# Patient Record
Sex: Female | Born: 1960 | Race: White | Hispanic: No | Marital: Married | Smoking: Never smoker
Health system: Southern US, Community
[De-identification: ages and names within clinical notes are randomized; demographics above are authoritative.]

## PROBLEM LIST (undated history)

## (undated) DIAGNOSIS — R319 Hematuria, unspecified: Secondary | ICD-10-CM

## (undated) HISTORY — PX: BLADDER SURGERY: SHX569

## (undated) HISTORY — PX: BREAST SURGERY: SHX581

## (undated) HISTORY — PX: ABDOMINAL HYSTERECTOMY: SHX81

## (undated) HISTORY — PX: BREAST ENHANCEMENT SURGERY: SHX7

---

## 2000-12-20 ENCOUNTER — Ambulatory Visit (HOSPITAL_COMMUNITY): Admission: RE | Admit: 2000-12-20 | Discharge: 2000-12-20 | Payer: Self-pay | Admitting: Family Medicine

## 2000-12-20 ENCOUNTER — Encounter: Payer: Self-pay | Admitting: Family Medicine

## 2002-11-16 ENCOUNTER — Inpatient Hospital Stay (HOSPITAL_COMMUNITY): Admission: AD | Admit: 2002-11-16 | Discharge: 2002-11-17 | Payer: Self-pay | Admitting: Cardiovascular Disease

## 2002-11-16 ENCOUNTER — Encounter: Payer: Self-pay | Admitting: Cardiovascular Disease

## 2002-11-17 ENCOUNTER — Encounter: Payer: Self-pay | Admitting: Cardiovascular Disease

## 2002-11-23 ENCOUNTER — Encounter: Payer: Self-pay | Admitting: Cardiovascular Disease

## 2002-11-23 ENCOUNTER — Ambulatory Visit (HOSPITAL_COMMUNITY): Admission: RE | Admit: 2002-11-23 | Discharge: 2002-11-23 | Payer: Self-pay | Admitting: Cardiovascular Disease

## 2003-01-10 ENCOUNTER — Encounter: Admission: RE | Admit: 2003-01-10 | Discharge: 2003-01-10 | Payer: Self-pay | Admitting: Cardiovascular Disease

## 2003-01-10 ENCOUNTER — Encounter: Payer: Self-pay | Admitting: Cardiovascular Disease

## 2003-08-03 ENCOUNTER — Ambulatory Visit (HOSPITAL_COMMUNITY): Admission: RE | Admit: 2003-08-03 | Discharge: 2003-08-03 | Payer: Self-pay | Admitting: Otolaryngology

## 2003-08-16 ENCOUNTER — Ambulatory Visit (HOSPITAL_COMMUNITY): Admission: RE | Admit: 2003-08-16 | Discharge: 2003-08-16 | Payer: Self-pay | Admitting: Otolaryngology

## 2003-08-16 ENCOUNTER — Encounter (INDEPENDENT_AMBULATORY_CARE_PROVIDER_SITE_OTHER): Payer: Self-pay | Admitting: *Deleted

## 2003-09-19 ENCOUNTER — Observation Stay (HOSPITAL_COMMUNITY): Admission: RE | Admit: 2003-09-19 | Discharge: 2003-09-20 | Payer: Self-pay | Admitting: Urology

## 2005-04-28 ENCOUNTER — Emergency Department (HOSPITAL_COMMUNITY): Admission: EM | Admit: 2005-04-28 | Discharge: 2005-04-28 | Payer: Self-pay | Admitting: Emergency Medicine

## 2006-01-12 ENCOUNTER — Inpatient Hospital Stay (HOSPITAL_COMMUNITY): Admission: EM | Admit: 2006-01-12 | Discharge: 2006-01-13 | Payer: Self-pay | Admitting: Cardiovascular Disease

## 2006-10-18 ENCOUNTER — Ambulatory Visit (HOSPITAL_COMMUNITY): Admission: RE | Admit: 2006-10-18 | Discharge: 2006-10-18 | Payer: Self-pay | Admitting: Cardiovascular Disease

## 2006-11-16 ENCOUNTER — Encounter: Admission: RE | Admit: 2006-11-16 | Discharge: 2006-11-16 | Payer: Self-pay | Admitting: Cardiovascular Disease

## 2007-10-20 ENCOUNTER — Ambulatory Visit (HOSPITAL_COMMUNITY): Admission: RE | Admit: 2007-10-20 | Discharge: 2007-10-20 | Payer: Self-pay | Admitting: Cardiovascular Disease

## 2008-10-19 ENCOUNTER — Ambulatory Visit (HOSPITAL_COMMUNITY): Admission: RE | Admit: 2008-10-19 | Discharge: 2008-10-19 | Payer: Self-pay

## 2009-10-29 ENCOUNTER — Ambulatory Visit (HOSPITAL_COMMUNITY): Admission: RE | Admit: 2009-10-29 | Discharge: 2009-10-29 | Payer: Self-pay | Admitting: Cardiovascular Disease

## 2010-10-25 ENCOUNTER — Encounter: Payer: Self-pay | Admitting: Cardiovascular Disease

## 2010-11-03 ENCOUNTER — Other Ambulatory Visit (HOSPITAL_COMMUNITY): Payer: Self-pay | Admitting: Cardiovascular Disease

## 2010-11-03 ENCOUNTER — Other Ambulatory Visit (HOSPITAL_BASED_OUTPATIENT_CLINIC_OR_DEPARTMENT_OTHER): Payer: Self-pay | Admitting: Cardiovascular Disease

## 2010-11-03 DIAGNOSIS — Z139 Encounter for screening, unspecified: Secondary | ICD-10-CM

## 2010-11-04 ENCOUNTER — Ambulatory Visit (HOSPITAL_COMMUNITY): Admission: RE | Admit: 2010-11-04 | Payer: Self-pay | Source: Home / Self Care | Admitting: Cardiovascular Disease

## 2010-11-08 ENCOUNTER — Encounter: Payer: Self-pay | Admitting: Cardiovascular Disease

## 2010-11-11 ENCOUNTER — Other Ambulatory Visit (HOSPITAL_COMMUNITY): Payer: Self-pay | Admitting: Cardiovascular Disease

## 2010-11-11 ENCOUNTER — Ambulatory Visit (HOSPITAL_COMMUNITY)
Admission: RE | Admit: 2010-11-11 | Discharge: 2010-11-11 | Disposition: A | Discharge status: Home / Self Care | Payer: BC Managed Care – PPO | Source: Ambulatory Visit | Attending: Cardiovascular Disease | Admitting: Cardiovascular Disease

## 2010-11-11 DIAGNOSIS — Z139 Encounter for screening, unspecified: Secondary | ICD-10-CM

## 2010-11-11 DIAGNOSIS — Z1231 Encounter for screening mammogram for malignant neoplasm of breast: Secondary | ICD-10-CM

## 2011-02-20 NOTE — Op Note (Signed)
Linda Carey, Linda Carey                           ACCOUNT NO.:  [id number]   MEDICAL RECORD NO.:  [id number]                   PATIENT TYPE:  OBV   LOCATION:  0356                                 FACILITY:  [facility]   PHYSICIAN:  Mark C. Vernie Ammons, M.D.               DATE OF BIRTH:  May 25, 1961   DATE OF PROCEDURE:  09/19/2003  DATE OF DISCHARGE:                                 OPERATIVE REPORT   PREOPERATIVE DIAGNOSIS:  Cystocele.   POSTOPERATIVE DIAGNOSIS:  Cystocele.   OPERATION/PROCEDURE:  Anterior repair.   SURGEON:  Mark C. Vernie Ammons, M.D.   ASSISTANT:  Maretta Bees. Vonita Moss, M.D.   ANESTHESIA:  General.   ESTIMATED BLOOD LOSS:  Approximately 25 mL.   DRAINS:  16-French Foley catheter.   SPECIMENS:  None.   COMPLICATIONS:  None.   INDICATIONS:  The patient is a 50 year old white female with a history of  stress urinary incontinence.  She was found on exam to have a significant  cystocele, no rectocele or enterocele.  She had undergone a previous vaginal  hysterectomy.  She is brought to the operating room today for a sling  procedure and repair of her cystocele.   DESCRIPTION OF PROCEDURE:  After informed consent was obtained, the patient  was brought to the major OR, placed on the table, administered general  anesthesia, then moved to the dorsal lithotomy position.  Her genitalia and  vagina were sterilely prepped and draped.  A 16-French Foley catheter was  then placed in the bladder, the bladder was drained and 1% lidocaine with  epinephrine was used to infiltrate the subvaginal mucosa in the midline.  After allowing adequate time for epinephrine effect, a midline incision was  then made after a weighted speculum was placed in the vagina.  A combination  of sharp and blunt dissection was then used to dissect the vaginal mucosa  from the cystocele laterally, first on the right and then left sides with  Allis clamps being placed on the vaginal mucosa sequentially.  I then  reapproximated the lateral fascia in the midline with interrupted 2-0 Vicryl  sutures to completely obliterate the cystocele.  A single 3-0 chromic suture  was placed in a bleeding vein with good control and then Dr. Vonita Moss  performed the [facility] sling procedure which has been dictated by him  separately.  After the sling was noted to be in good position, the wound was  irrigated with antibiotic solution and the redundant vaginal mucosa was  excised.  The mucosa was then reapproximated in the midline with a running 2-  0 Vicryl suture.  Vaginal packing, which was coated with Neosporin, was then  inserted and the patient was awakened and taken to  the recovery room.  Her suprapubic incisions were closed with Dermabond.  She tolerated the procedure well. There were no intraoperative  complications.  Needle, sponge and instrument counts were reported correct  at the end of the operation.                                               Mark C. Vernie Ammons, M.D.    MCO/MEDQ  D:  09/19/2003  T:  09/19/2003  Job:  [id number]

## 2011-02-20 NOTE — Discharge Summary (Signed)
Linda Carey, Linda Carey                           ACCOUNT NO.:  [id number]   MEDICAL RECORD NO.:  [id number]                   PATIENT TYPE:  INP   LOCATION:  3730                                 FACILITY:  [facility]   PHYSICIAN:  Ricki Rodriguez, M.D.               DATE OF BIRTH:  19-Jun-1961   DATE OF ADMISSION:  11/16/2002  DATE OF DISCHARGE:  11/17/2002                                 DISCHARGE SUMMARY   DISCHARGE DIAGNOSES:  1. Noncardiac chest pain.  2. Possible reflux esophagitis.   DISCHARGE MEDICATIONS:  1. Nexium 40 mg one daily.  2. Darvocet-N 100 four times daily as needed.  3. Aleve daily x1 week.   ACTIVITY:  As tolerated without lifting, pulling or pushing x1 week.   DIET:  Low fat, low salt diet.   SPECIAL INSTRUCTIONS:  The patient to get mammogram and GYN followup and ask  for a gastroenterology consult.   FOLLOW UP:  Follow up with Dr. Algie Coffer in two weeks.   HISTORY OF PRESENT ILLNESS:  This 50 year old, white female presented with  one-day history of dull precordial chest pain not increased by breathing.  The patient had transient ST depression in less than three minutes of  treadmill stress test.  She has a history of alcohol use in the past with  reflux esophagitis.  No cardiac risk factors of diabetes, hypertension,  smoking, obesity or family history of coronary artery disease.  She had a  hysterectomy 10 years ago and gallbladder surgery 26 years ago.   PHYSICAL EXAMINATION:  VITAL SIGNS:  Temperature 97.2, pulse 94,  respirations 22, blood pressure 103/55, height 5 feet 2 inches, weight 136  pounds.  GENERAL:  The patient was alert and oriented x3.  HEENT:  Head normocephalic and atraumatic.  Hazel eyes.  TMs okay.  Sclerae  pink and moist.  NECK:  No JVD or carotid bruit.  BREASTS:  Implants with scar of surgery.  Chest wall nontender.  HEART:  Normal S1, S2.  ABDOMEN:  Soft and nontender.  EXTREMITIES:  No clubbing, cyanosis or edema.  NEUROLOGIC:  Cranial nerves 2-12 grossly intact.   LABORATORY DATA AND X-RAY FINDINGS:  Borderline hemoglobin of 11.9,  hematocrit 34, WBC normal, platelets normal.  Sodium borderline at 134,  potassium normal, glucose borderline 132, BUN and creatinine normal.  CK-MB  and troponin I normal.  Iron level normal.  X-ray of chest unremarkable.   Cardiolite stress test with ejection fraction of 64% and normal left  ventricular wall motion without any reversible ischemia.  EKG revealed  normal sinus rhythm.   HOSPITAL COURSE:  The patient was admitted to the telemetry unit.  Myocardial infarction was ruled out.  She underwent a stress test that  failed to show any ischemia.  The patient had a normal iron study.  She was  advised to have a gastroenterology and GYN followup on outpatient  basis.  She was placed on proton pump inhibitor, analgesic with other medications.  She will be followed by me in two weeks.   CONDITION ON DISCHARGE:  Stable.                                               Ricki Rodriguez, M.D.    ASK/MEDQ  D:  02/09/2003  T:  02/12/2003  Job:  [id number]

## 2011-02-20 NOTE — Discharge Summary (Signed)
NAMELAELIA, ANGELO                 ACCOUNT NO.:  [id number]   MEDICAL RECORD NO.:  [id number]          PATIENT TYPE:  INP   LOCATION:  3706                         FACILITY:  [facility]   PHYSICIAN:  Ricki Rodriguez, M.D.  DATE OF BIRTH:  October 16, 1960   DATE OF ADMISSION:  01/12/2006  DATE OF DISCHARGE:  01/13/2006                                 DISCHARGE SUMMARY   PRINCIPAL DIAGNOSES:  1.  Chest pain.  2.  Family history of ischemic heart disease.   PRINCIPAL PROCEDURE:  Left heart catheterization, selective coronary  angiography, left ventricular function study on 01/13/2006.   DISCHARGE MEDICATIONS:  Nexium 40 mg 1 daily.  Aspirin 81 mg 1 daily.  Calcium 600 mg 1 or 2 daily.   FOLLOWUP:  Follow up with Dr. Orpah Cobb in 2 weeks.  Patient to call [phone].   SPECIAL INSTRUCTIONS:  Patient to remind the doctor for referral to GI  specialist.   DIET:  Low-fat, low-salt diet.   ACTIVITY:  No driving or lifting for 2 days.   INSTRUCTIONS:  Patient to notify of right groin pain, swelling, or  discharge.   HISTORY:  This 50 year old white female with recurrent chest pain described  as tightness around the chest with a sweating spell.  Was admitted for rule-  out myocardial infarction.  Her cardiac risk factors were unremarkable  except for premature coronary artery disease in the family.   PHYSICAL EXAMINATION:  Pulse 70, respiration 20, blood pressure 110/70,  height 5 feet 2 inches, weight 130 pounds.   GENERAL:  Patient is alert and oriented x3.   HEENT:  Patient is normocephalic and atraumatic with hazel eyes, equally  full, round, and reactive to light.  Extraocular movements are intact.  Conjunctivae are pink.  Sclerae are white.   NECK:  No JVD.  No carotid bruit.   LUNGS:  Clear bilaterally.   HEART:  Normal S1, S2.   ABDOMEN:  Soft and nontender.   EXTREMITIES:  No edema, cyanosis or clubbing.   CNS:  Cranial nerves grossly intact.  Patient has  bilaterally full grips.   LABORATORY DATA:  Normal hemoglobin and hematocrit, initial WBC count was  falsely high at 30,400.  Subsequent WBC count was 8300, platelet counts were  normal.  Electrolytes were near-normal.  BUN 12, creatinine 0.9.  Liver  enzymes were normal.  Cholesterol, triglycerides, and LDL levels were  normal.   Chest x-ray was unremarkable.  An EKG was normal.  Cardiac catheterization  showed normal coronaries and normal LV systolic function.   HOSPITAL COURSE:  Patient was admitted to telemetry unit.  Myocardial  infarction was ruled out.  She underwent cardiac catheterization that showed  normal coronaries and normal LV systolic function, and the patient was  discharged the same day with a followup as needed in 2 weeks and was given  instruction for a referral to GI specialist.  She was started on Nexium 40  mg 1 daily.      Ricki Rodriguez, M.D.  Electronically Signed     ASK/MEDQ  D:  03/24/2006  T:  03/24/2006  Job:  [id number]

## 2011-02-20 NOTE — H&P (Signed)
Linda Carey, Linda Carey                 ACCOUNT NO.:  [id number]   MEDICAL RECORD NO.:  [id number]          PATIENT TYPE:  INP   LOCATION:  3706                         FACILITY:  [facility]   PHYSICIAN:  Ricki Rodriguez, M.D.  DATE OF BIRTH:  04/03/1961   DATE OF ADMISSION:  01/12/2006  DATE OF DISCHARGE:                                HISTORY & PHYSICAL   CHIEF COMPLAINT:  Chest pain.   HISTORY OF PRESENT ILLNESS:  This is a 50 year old white female who comes in  for recurrent chest pain described as tightness for the last one week  yesterday.  She had mild chest pain today.  She had sweaty, hot feeling,  along with chest tightness, followed by diarrhea episode.  When she returned  to work, she had a vomiting episode.  Currently, the patient is chest  painfree.   PAST MEDICAL HISTORY:  Negative for diabetes, hypertension, smoking.  Positive for rare alcohol intake.  Negative for drug intake, elevated  cholesterol level, myocardial infarction, obesity, exercise.   FAMILY HISTORY:  Premature coronary artery disease.  Positive history of  reflux esophagitis, history of migraine headache, history of Meniere's  disease followed by Dr. Hermelinda Medicus an ENT physician.   PAST SURGICAL HISTORY:  1.  Hysterectomy 13 years ago.  2.  Gallbladder surgery 29 years ago.  3.  Bladder surgery 2004.  4.  Bilateral breast implants in 2003.   MEDICATIONS:  Aspirin, Plavix, Ranexa was ordered but the patient did not  take.   ALLERGIES:  CLINDAMYCIN causing rash.   SOCIAL HISTORY:  The patient is married.  Husband is a 60 year old has  hypertension.  The patient has one daughter, Duwayne Heck, 37 year old.   FAMILY HISTORY:  Mother living at age 12 with blood pressure and cholesterol  problem.  Father living at age 58 and healthy.  The patient's has one  brother living and well and two sisters living and well.   REVIEW OF SYSTEMS:  The patient denies any recent weight gain, weight loss.  Admits to  wearing glasses.  Occasional dizziness, chest pain, constipation.  No history of hearing loss.  Positive history of tinnitus.  Negative history  of cough, asthma, hemoptysis, pneumonia, dyspnea, leg edema, bleeding from  the bowels, stomach ulcers, hepatitis, blood transfusion, kidney stone,  stroke, seizures or psychiatric admissions.  Positive history of reflux  esophagitis.   PHYSICAL EXAMINATION:  VITAL SIGNS:  Pulse 70, respiration 20, blood  pressure 110/70.  Height 5 feet 2 inch, weight approximately 130 pounds.  GENERAL:  The patient is alert and oriented x3.  HEENT:  The patient is normocephalic and atraumatic with hazel eyes.  Pupils  are equal, round, and reactive to light.  Extraocular movements intact.  Conjunctivae pink.  Sclerae white.  NECK:  No JVD, no carotid bruits.  LUNGS:  Clear bilaterally.  HEART:  Normal S1 and S2.  ABDOMEN:  Soft and nontender.  EXTREMITIES:  No clubbing, cyanosis, or edema.  CNS:  Cranial nerves grossly intact.  Bilateral equal grips.   LABORATORY DATA:  Pending.  EKG done in the  office was normal sinus rhythm  with vertical ectopy.   Chest x-ray pending.   IMPRESSION:  1.  Chest pain.  2.  Anxiety.   PLAN:  Admit the patient to telemetry unit to rule out myocardial  infarction.  Cardiac catheterization versus nuclear stress test.      Ricki Rodriguez, M.D.  Electronically Signed     ASK/MEDQ  D:  01/12/2006  T:  01/12/2006  Job:  [id number]

## 2011-02-20 NOTE — H&P (Signed)
   NAMEGIANNAMARIE, PAULUS                           ACCOUNT NO.:  [id number]   MEDICAL RECORD NO.:  [id number]                   PATIENT TYPE:  OIB   LOCATION:  2899                                 FACILITY:  [facility]   PHYSICIAN:  Hermelinda Medicus, M.D.                DATE OF BIRTH:  1960-12-18   DATE OF ADMISSION:  08/16/2003  DATE OF DISCHARGE:                                HISTORY & PHYSICAL   This patient is a 50 year old female who has had __________types of problems  in the past.  She has difficulty breathing through her nose and also has  ____________ -type symptoms with congestion also of her ear.  Her left ear  feels clogged up, her right ear feels like it is echoing.  She has been on  antibiotics in the past for otitis infections on several occasions.  I have  put her on amoxicillin and Z-pack, and also I have placed her on Micro-K 10  mEq, but she had a little bit of vertigo with this situation.  She had been  given meclozine for this.  She has had otitis infections for the last five  years.  She is a nonsmoker.   She had a hearing evaluation to make sure she does not really have an  apparent problem, and this is showing  ____________________________________________________.  She is being very,  very careful to _________________.  She has resolved her sinusitis and now  ______________septal reconstruction in an effort to aid the sinusitis  _____________________eustachian tubes clear of otitis, and in effect, hoping  to improve the vertigo as well as the otitis.   ALLERGIES:  CLINDAMYCIN, it give her GI upset.   MEDICATIONS:  Chlorthalidone.                                                Hermelinda Medicus, M.D.    JC/MEDQ  D:  08/16/2003  T:  08/16/2003  Job:  [id number]   cc:   [facility] [place], M.D.  [address]  [place] [address]

## 2011-02-20 NOTE — Op Note (Signed)
NAMEIOLANDA, FOLSON                           ACCOUNT NO.:  [id number]   MEDICAL RECORD NO.:  [id number]                   PATIENT TYPE:  OIB   LOCATION:  2899                                 FACILITY:  [facility]   PHYSICIAN:  Hermelinda Medicus, M.D.                DATE OF BIRTH:  11/26/1960   DATE OF PROCEDURE:  08/16/2003  DATE OF DISCHARGE:  08/16/2003                                 OPERATIVE REPORT   PREOPERATIVE DIAGNOSIS:  Septal deviation, turbinate hypertrophy with  history of sinusitis.   POSTOPERATIVE DIAGNOSIS:  Septal deviation, turbinate hypertrophy with  history of sinusitis.   OPERATION PERFORMED:  Septal reconstruction, turbinate reduction.   SURGEON:  Hermelinda Medicus, M.D.   ANESTHESIA:  1% Xylocaine with epinephrine 5mL.  Topical cocaine _______  mg  with MAC anesthesia.   DESCRIPTION OF PROCEDURE:  Patient placed in supine position under local  anesthesia was prepped and draped in the appropriate manner.  Using  _________________________________  The usual head drape was then placed.  The nose was anesthetized with the above mentioned anesthesia and then  _________________hemitransfixion incision was made on the left side, carried  back along the quadrilateral cartilage to the more posterior aspect of the  quadrilateral cartilage and ethmoid septum and this was grossly deviated  over to the left.  We took a strip of posterior quadrilateral cartilage and  then some of the ethmoid septum using the open and closed Jansen-Middletons  and the [employer] forceps.  Once this was brought back to the midline, we  approached the floor of the nose where a portion of quadrilateral cartilage  was also taken using a 4 mm chisel and the vomerine septum was also taken.  Again the [facility] forceps were used.  The septum was established at the  midline and then the only other problem was that the columella region was in  the right side of the nose, deviated over to the right, so  therefore, from  the original incision on the left, we carried it anterior, around the  quadrilateral cartilage columella region and mobilized this between the two  medial crura of the lower lateral cartilages.  Once we mobilized this, we  brought that back to the midline in the columella and septum posteriorly  being in the midline and this came back very nicely.  Once this was  achieved, then we closed this with a 5-0 plain catgut. We went through-and-  through simple sutures for hemostasis, using 4-0 plain times two and 4-0 PDS  was used to stabilize the columella in the midline.  The inferior turbinates  were aggressively outfractured to gain more space and the right  middle turbinate was crushed as it was somewhat of a concha bullosa taking  up considerable space.  No mucous membrane was removed and nasal dressings  with Telfa were placed.  The patient tolerated the procedure very well and  is doing well postoperatively.  Her follow-up will be tomorrow, in one week,  three weeks and six weeks.                                               Hermelinda Medicus, M.D.    JC/MEDQ  D:  08/16/2003  T:  08/16/2003  Job:  [id number]   cc:   Ricki Rodriguez, M.D.  [address]  [place] [address]   [address]

## 2011-02-20 NOTE — Cardiovascular Report (Signed)
[name], Linda Carey                 ACCOUNT NO.:  [id number]   MEDICAL RECORD NO.:  [id number]          PATIENT TYPE:  INP   LOCATION:  3706                         FACILITY:  [facility]   PHYSICIAN:  Ricki Rodriguez, M.D.  DATE OF BIRTH:  29-Oct-1960   DATE OF PROCEDURE:  01/13/2006  DATE OF DISCHARGE:                              CARDIAC CATHETERIZATION   PROCEDURE:  1.  Left heart catheterization.  2.  Selective coronary angiography.  3.  Left ventricular function study.   INDICATIONS:  This 50 year old white female with recurrent chest pain in  spite of normal stress echocardiogram a few months ago.   APPROACH:  Right femoral artery using 4-French sheath and catheters.   COMPLICATIONS:  None.   HEMODYNAMIC DATA:  The left ventricular pressure was 90/2/15 and aortic  pressure was 84/53.   CORONARY ANATOMY:  The left main coronary artery was unremarkable.   Left anterior descending coronary artery was also unremarkable.  It barely  reached up to the apex of the heart and diagonal 1 vessel was unremarkable.  Diagonal 2 and 3 were very small vessels.   Left circumflex coronary artery:  The left circumflex coronary artery was  unremarkable.  Its ramus branch was also unremarkable.  Obtuse marginal  branch 3 was also unremarkable.  Obtuse marginal branch 1 and 2 were very  small.   Right coronary artery:  The right coronary artery was dominant and its  posterolateral branch was unremarkable.  Its posterior descending coronary  artery was a long vessel and unremarkable.   LEFT VENTRICULOGRAM:  The left ventriculogram showed normal left ventricular  systolic function with ejection fraction of 65-70%.   IMPRESSION:  1.  Normal coronaries.  2.  Normal left ventricular systolic function.   RECOMMENDATIONS:  This patient will be treated medically for non-cardiac  chest pain.      Ricki Rodriguez, M.D.  Electronically Signed     ASK/MEDQ  D:  01/13/2006  T:  01/13/2006  Job:   [id number]

## 2011-02-20 NOTE — Op Note (Signed)
NAMECLARABELL, Linda Carey                           ACCOUNT NO.:  [id number]   MEDICAL RECORD NO.:  [id number]                   PATIENT TYPE:  AMB   LOCATION:  DAY                                  FACILITY:  [facility]   PHYSICIAN:  Maretta Bees. Vonita Moss, M.D.             DATE OF BIRTH:  Oct 22, 1960   DATE OF PROCEDURE:  09/19/2003  DATE OF DISCHARGE:                                 OPERATIVE REPORT   PREOPERATIVE DIAGNOSES:  Stress incontinence and cystocele.   POSTOPERATIVE DIAGNOSES:  Stress incontinence and cystocele.   PROCEDURE:  SPARC sling insertion.   SURGEON:  Maretta Bees. Vonita Moss, M.D.   ASSISTANT:  Veverly Fells. Vernie Ammons, M.D.   ANESTHESIA:  General.   INDICATIONS FOR PROCEDURE:  This 50 year old white female has a long history  of bladder symptoms with frequency, stress incontinence, occasional urge  incontinence and she has had some past history of UTI's.  She does not wear  pads but changes clothes twice a day.  Urodynamic studies reveal no  uninhibited bladder contractions. She leaked at 70 cm of water pressure.  She was counseled about a SPARC sling procedure and told about the risk of  hemorrhage, infection, erosion or retention.   DESCRIPTION OF PROCEDURE:  The patient was brought to the operating room and  placed in lithotomy position.  The external genitalia and lower abdomen were  prepped and draped in the usual fashion, Foley catheter was inserted and  anterior repair and correction of cystocele was dictated separately by Dr.  Vernie Ammons.  I then developed the periurethral area to obtain access to the  endopelvic fascia.  Incisions were made with a 15 blade on each side of the  midline and through these incisions a SPARC needle was passed down to the  top of the symphysis pubis and marched back down the back of the symphysis  pubis to exit through the endopelvic fascia on each side of the bladder neck  and urethra.  I then cystoscoped her and there was no evidence of injury or  perforation of the bladder.  In addition, the needles palpably were in  excellent position.  The [facility] sling was then brought up retropubically with  the needles and the sling was placed in mid urethra with no tension by using  a hemostat between the urethra with the Foley catheter in it and the sling.  The slings were cut off suprapubically and retracted into the wound and the  suprapubic wounds were irrigated with antibiotic solution.  With the sling  in good position, the Foley catheter in place, the vaginal incision was  closed with running 2-0 Vicryl.  The Foley catheter was connected to close  drainage, a vaginal pack was inserted.  Sponge, needle and instrument count  was correct. She had a fairly vascular tissue and probably did lose about  300 mL of blood. She tolerated the procedure well, however. She was  taken to  the recovery room in good condition.                                               Maretta Bees. Vonita Moss, M.D.    LJP/MEDQ  D:  09/19/2003  T:  09/19/2003  Job:  [id number]

## 2011-06-03 ENCOUNTER — Encounter: Payer: Self-pay | Admitting: Gastroenterology

## 2011-06-30 ENCOUNTER — Other Ambulatory Visit: Payer: BC Managed Care – PPO | Admitting: Gastroenterology

## 2012-02-06 ENCOUNTER — Emergency Department (HOSPITAL_COMMUNITY): Payer: BC Managed Care – PPO

## 2012-02-06 ENCOUNTER — Emergency Department (HOSPITAL_COMMUNITY)
Admission: EM | Admit: 2012-02-06 | Discharge: 2012-02-06 | Disposition: A | Discharge status: Home / Self Care | Payer: BC Managed Care – PPO | Attending: Emergency Medicine | Admitting: Emergency Medicine

## 2012-02-06 ENCOUNTER — Encounter (HOSPITAL_COMMUNITY): Payer: Self-pay | Admitting: Emergency Medicine

## 2012-02-06 DIAGNOSIS — R079 Chest pain, unspecified: Secondary | ICD-10-CM | POA: Insufficient documentation

## 2012-02-06 DIAGNOSIS — R4701 Aphasia: Secondary | ICD-10-CM | POA: Insufficient documentation

## 2012-02-06 DIAGNOSIS — R5381 Other malaise: Secondary | ICD-10-CM | POA: Insufficient documentation

## 2012-02-06 DIAGNOSIS — R51 Headache: Secondary | ICD-10-CM | POA: Insufficient documentation

## 2012-02-06 DIAGNOSIS — R531 Weakness: Secondary | ICD-10-CM

## 2012-02-06 DIAGNOSIS — H538 Other visual disturbances: Secondary | ICD-10-CM | POA: Insufficient documentation

## 2012-02-06 HISTORY — DX: Hematuria, unspecified: R31.9

## 2012-02-06 LAB — DIFFERENTIAL
Basophils Absolute: 0.1 10*3/uL (ref 0.0–0.1)
Basophils Relative: 1 % (ref 0–1)
Eosinophils Relative: 4 % (ref 0–5)
Neutrophils Relative %: 52 % (ref 43–77)

## 2012-02-06 LAB — CBC
HCT: 35.5 % — ABNORMAL LOW (ref 36.0–46.0)
Hemoglobin: 12.3 g/dL (ref 12.0–15.0)
MCHC: 34.6 g/dL (ref 30.0–36.0)
MCV: 84.1 fL (ref 78.0–100.0)
RDW: 12.7 % (ref 11.5–15.5)
WBC: 4.4 10*3/uL (ref 4.0–10.5)

## 2012-02-06 LAB — BASIC METABOLIC PANEL
CO2: 26 mEq/L (ref 19–32)
Calcium: 9.2 mg/dL (ref 8.4–10.5)
Chloride: 105 mEq/L (ref 96–112)
GFR calc Af Amer: >90 mL/min (ref >90)
Potassium: 3.8 mEq/L (ref 3.5–5.1)

## 2012-02-06 MED ORDER — PROMETHAZINE HCL 25 MG/ML IJ SOLN
25.0000 mg | Freq: Once | INTRAMUSCULAR | Status: DC
  Filled 2012-02-06: qty 1

## 2012-02-06 MED ORDER — AMOXICILLIN-POT CLAVULANATE 875-125 MG PO TABS: 1.0000 | ORAL_TABLET | Freq: Two times a day (BID) | ORAL | Status: AC

## 2012-02-06 MED ORDER — LORAZEPAM 2 MG/ML IJ SOLN
INTRAMUSCULAR | Status: AC
  Administered 2012-02-06: 1 mg via INTRAVENOUS
  Filled 2012-02-06: qty 1

## 2012-02-06 MED ORDER — LORAZEPAM 2 MG/ML IJ SOLN
1.0000 mg | Freq: Once | INTRAMUSCULAR | Status: AC
  Administered 2012-02-06: 1 mg via INTRAVENOUS

## 2012-02-06 MED ORDER — KETOROLAC TROMETHAMINE 60 MG/2ML IM SOLN
60.0000 mg | Freq: Once | INTRAMUSCULAR | Status: AC
  Administered 2012-02-06: 60 mg via INTRAMUSCULAR
  Filled 2012-02-06: qty 2

## 2012-02-06 NOTE — ED Provider Notes (Signed)
  Physical Exam  BP 98/68  Pulse 72  Temp(Src) 98.2 F (36.8 C) (Oral)  Resp 16  SpO2 96%  Physical Exam  ED Course  Procedures  Dr. Algie Coffer was alerted about the patient. He is here in the ER and went into see the patient as well. He will follow up with her in his office as well. He felt this was appropriate. Patient has remained stable here in the ER. No further symptoms here in the CDU.        Carlyle Dolly, PA-C 02/06/12 [phone]

## 2012-02-06 NOTE — ED Notes (Signed)
Pt reports intermittant left sided chest pain onset yesterday. Pt woke up today with slurred speech and double vision. Pt reports all symptoms resolved at this time.

## 2012-02-06 NOTE — ED Provider Notes (Signed)
History     CSN: [id number]  Arrival date & time 02/06/12  4098   First MD Initiated Contact with Patient 02/06/12 1010      Chief Complaint  Patient presents with  . Aphasia  . Blurred Vision  . Chest Pain    (Consider location/radiation/quality/duration/timing/severity/associated sxs/prior treatment) Patient is a 51 y.o. female presenting with weakness. The history is provided by the patient.  Weakness The primary symptoms include headaches, visual change and speech change. Primary symptoms do not include paresthesias or fever. The symptoms began less than 1 hour ago. The episode lasted 1 hour. The symptoms are resolved. The neurological symptoms are focal. Context: Patient states when she woke up this morning she was unable to see out of part of her visual field on the right side was having difficulty with her speech.  The headache began today (The headache today is different however she states she gets a lot of sinus headaches over the frontal region of her head). The headache developed gradually. Headache is a new problem. The headache is present continuously. The pain from the headache is at a severity of 4/10. Location/region(s) of the headache: occipital. Associated with: The headache started after the above. The headache is associated with visual change. The headache is not associated with photophobia, neck stiffness, paresthesias, weakness or loss of balance.  The visual change does not include photophobia.  Additional symptoms do not include neck stiffness, weakness, loss of balance or photophobia.    Past Medical History  Diagnosis Date  . Blood in urine     Past Surgical History  Procedure Date  . Abdominal hysterectomy   . Bladder surgery   . Breast surgery Breast implants  . Breast enhancement surgery     History reviewed. No pertinent family history.  History  Substance Use Topics  . Smoking status: Never Smoker   . Smokeless tobacco: Not on file  . Alcohol  Use: Yes     Occasional    OB History    Grav Para Term Preterm Abortions TAB SAB Ect Mult Living                  Review of Systems  Constitutional: Negative for fever.  HENT: Negative for neck stiffness.   Eyes: Negative for photophobia.  Respiratory: Positive for chest tightness. Negative for cough and shortness of breath.        Patient states she's had intermittent left chest tightness for the last one week. The pain does not radiate anywhere and is not cause shortness of breath, diaphoresis, nausea or vomiting. Patient states she's had this pain multiple times in the past and followed up with cardiology who did a catheterization which was negative  Neurological: Positive for speech change and headaches. Negative for weakness, paresthesias and loss of balance.  All other systems reviewed and are negative.    Allergies  Shellfish allergy and Clindamycin/lincomycin  Home Medications   Current Outpatient Rx  Name Route Sig Dispense Refill  . FISH OIL PO Oral Take 1 capsule by mouth daily.      BP 120/72  Pulse 79  Temp(Src) 98.4 F (36.9 C) (Oral)  Resp 16  SpO2 99%  Physical Exam  Nursing note and vitals reviewed. Constitutional: She is oriented to person, place, and time. She appears well-developed and well-nourished. No distress.  HENT:  Head: Normocephalic and atraumatic.  Eyes: EOM are normal. Pupils are equal, round, and reactive to light.  Fundoscopic exam:  The right eye shows no papilledema.       The left eye shows no papilledema.  Neck: Normal range of motion. Neck supple. Carotid bruit is not present.  Cardiovascular: Normal rate, regular rhythm, normal heart sounds and intact distal pulses.  Exam reveals no friction rub.   No murmur heard. Pulmonary/Chest: Effort normal and breath sounds normal. She has no wheezes. She has no rales.  Abdominal: Soft. Bowel sounds are normal. She exhibits no distension. There is no tenderness. There is no rebound  and no guarding.  Musculoskeletal: Normal range of motion. She exhibits no tenderness.       No edema  Lymphadenopathy:    She has no cervical adenopathy.  Neurological: She is alert and oriented to person, place, and time. She has normal strength. No cranial nerve deficit or sensory deficit. She displays a negative Romberg sign. Coordination and gait normal.       No visual field cuts currently  Skin: Skin is warm and dry. No rash noted.  Psychiatric: She has a normal mood and affect. Her behavior is normal.    ED Course  Procedures (including critical care time)  Labs Reviewed  CBC - Abnormal; Notable for the following:    HCT 35.5 (*)    All other components within normal limits  DIFFERENTIAL - Abnormal; Notable for the following:    Monocytes Relative 14 (*)    All other components within normal limits  BASIC METABOLIC PANEL  POCT I-STAT TROPONIN I   Dg Chest 2 View  02/06/2012  *RADIOLOGY REPORT*  Clinical Data: Chest pain.  CHEST - 2 VIEW 02/06/2012:  Comparison: Two-view chest x-ray 01/13/2006, 04/28/2005, 11/16/2002.  Findings: Cardiomediastinal silhouette unremarkable, unchanged. Lungs clear.  Bronchovascular markings normal.  Pulmonary vascularity normal.  No pneumothorax.  No pleural effusions.  Mild degenerative changes involving the thoracic spine.  No significant interval change.  IMPRESSION: No acute cardiopulmonary disease.  Stable examination.  Original Report Authenticated By: Arnell Sieving, M.D.   Ct Head Wo Contrast  02/06/2012  *RADIOLOGY REPORT*  Clinical Data:  Slurred speech, headache, right eye abnormality, recent sinus problems  CT HEAD WITHOUT CONTRAST CT MAXILLOFACIAL WITHOUT CONTRAST  Technique:  Multidetector CT imaging of the head and maxillofacial structures were performed using the standard protocol without intravenous contrast. Multiplanar CT image reconstructions of the maxillofacial structures were also generated.  Comparison:  Head CT dated April 28, 2005  CT HEAD  Findings: The ventricles are normal in size, shape, and position. There is no mass effect or midline shift.  No acute hemorrhage or abnormal extra-axial fluid collections are identified.  The gray/white differentiation is normal.  The orbits, calvarium, visualized paranasal sinuses have a normal appearance.  IMPRESSION: Normal CT scan of the head with no evidence of acute intracranial abnormality.  CT MAXILLOFACIAL  Findings:   There is mild mucosal thickening at the base of the left maxillary sinus.  No air-fluid levels are present.  The right maxillary sinus, sphenoid sinus, frontal sinus, and ethmoid air cells are clear and well-aerated.  Both orbits have a normal appearance.  There is no evidence of facial bone fracture or dislocation.  There is no adenopathy.  IMPRESSION: Mild mucosal thickening at the base of the left maxillary sinus.  Original Report Authenticated By: Brandon Melnick, M.D.   Ct Maxillofacial Wo Cm  02/06/2012  *RADIOLOGY REPORT*  Clinical Data:  Slurred speech, headache, right eye abnormality, recent sinus problems  CT HEAD WITHOUT CONTRAST CT  MAXILLOFACIAL WITHOUT CONTRAST  Technique:  Multidetector CT imaging of the head and maxillofacial structures were performed using the standard protocol without intravenous contrast. Multiplanar CT image reconstructions of the maxillofacial structures were also generated.  Comparison:  Head CT dated April 28, 2005  CT HEAD  Findings: The ventricles are normal in size, shape, and position. There is no mass effect or midline shift.  No acute hemorrhage or abnormal extra-axial fluid collections are identified.  The gray/white differentiation is normal.  The orbits, calvarium, visualized paranasal sinuses have a normal appearance.  IMPRESSION: Normal CT scan of the head with no evidence of acute intracranial abnormality.  CT MAXILLOFACIAL  Findings:   There is mild mucosal thickening at the base of the left maxillary sinus.  No air-fluid  levels are present.  The right maxillary sinus, sphenoid sinus, frontal sinus, and ethmoid air cells are clear and well-aerated.  Both orbits have a normal appearance.  There is no evidence of facial bone fracture or dislocation.  There is no adenopathy.  IMPRESSION: Mild mucosal thickening at the base of the left maxillary sinus.  Original Report Authenticated By: Brandon Melnick, M.D.     Date: 02/06/2012  Rate: 85  Rhythm: normal sinus rhythm  QRS Axis: normal  Intervals: normal  ST/T Wave abnormalities: normal  Conduction Disutrbances:none  Narrative Interpretation:   Old EKG Reviewed: Unchanged   No diagnosis found.    MDM   Patient with symptoms concerning for TIA today. She woke up and she had a visual cut in her right eye as well as aphasia. She states this lasted about one hour but by the time she arrived here her symptoms have resolved.  Now she is reporting a headache at the posterior portion of her head.  Patient states yesterday she fell normally except for the last 1 week she's had intermittent left chest pain which she will get occasionally and has followed up with cardiology for this pain. She had a negative cath more than 5 years ago.   Patient has a normal exam currently. There are no visual field cuts or weakness. She is able to ambulate without any difficulty in her speech is normal.  CBC, BMP, i-STAT troponin, head CT including sinuses pending. Patient states over the last 2-3 week she's had multiple sinus issues and has had several courses of antibiotics.  12:47 PM CT of the head and sinuses was unrevealing. Labs within normal limits. Patient moved to CDU to get MR/MRA done for further evaluation.       Gwyneth Sprout, MD 02/06/12 1247

## 2012-02-06 NOTE — ED Notes (Signed)
Pt. C/o that she woke up at 8am with slurred speech and couldn't see out of her right eye. These problems resolved on the way to the ED. Pt. Currently only c/o of headache. Pt denies any weakness or facial drooping upon onset of symptoms. Currently pt. Is a&o x4, speech clear.

## 2012-02-06 NOTE — Discharge Instructions (Signed)
Follow up with Dr. Algie Coffer for a recheck and further evaluation. Return here as needed.

## 2012-02-06 NOTE — ED Notes (Signed)
Patient transported to CT 

## 2012-02-07 NOTE — ED Provider Notes (Signed)
Medical screening examination/treatment/procedure(s) were conducted as a shared visit with non-physician practitioner(s) and myself.  I personally evaluated the patient during the encounter   Gwyneth Sprout, MD 02/07/12 [phone]

## 2012-03-10 ENCOUNTER — Other Ambulatory Visit: Payer: Self-pay | Admitting: Cardiovascular Disease

## 2012-03-10 DIAGNOSIS — Z1231 Encounter for screening mammogram for malignant neoplasm of breast: Secondary | ICD-10-CM

## 2012-03-11 ENCOUNTER — Ambulatory Visit: Payer: BC Managed Care – PPO

## 2012-10-27 ENCOUNTER — Ambulatory Visit
Admission: RE | Admit: 2012-10-27 | Discharge: 2012-10-27 | Disposition: A | Discharge status: Home / Self Care | Payer: BC Managed Care – PPO | Source: Ambulatory Visit | Attending: Cardiovascular Disease | Admitting: Cardiovascular Disease

## 2012-10-27 ENCOUNTER — Other Ambulatory Visit: Payer: Self-pay | Admitting: Cardiovascular Disease

## 2012-10-27 DIAGNOSIS — J40 Bronchitis, not specified as acute or chronic: Secondary | ICD-10-CM

## 2015-06-14 ENCOUNTER — Other Ambulatory Visit (HOSPITAL_COMMUNITY): Payer: Self-pay | Admitting: Physician Assistant

## 2015-06-14 DIAGNOSIS — Z1231 Encounter for screening mammogram for malignant neoplasm of breast: Secondary | ICD-10-CM

## 2015-06-20 ENCOUNTER — Other Ambulatory Visit (HOSPITAL_COMMUNITY): Payer: Self-pay | Admitting: Physician Assistant

## 2015-06-20 ENCOUNTER — Ambulatory Visit (HOSPITAL_COMMUNITY)
Admission: RE | Admit: 2015-06-20 | Discharge: 2015-06-20 | Disposition: A | Discharge status: Home / Self Care | Payer: Managed Care, Other (non HMO) | Source: Ambulatory Visit | Attending: Physician Assistant | Admitting: Physician Assistant

## 2015-06-20 DIAGNOSIS — Z1231 Encounter for screening mammogram for malignant neoplasm of breast: Secondary | ICD-10-CM

## 2015-06-28 ENCOUNTER — Ambulatory Visit (INDEPENDENT_AMBULATORY_CARE_PROVIDER_SITE_OTHER): Payer: Managed Care, Other (non HMO) | Admitting: Podiatry

## 2015-06-28 ENCOUNTER — Other Ambulatory Visit: Payer: Self-pay | Admitting: Physician Assistant

## 2015-06-28 ENCOUNTER — Ambulatory Visit (INDEPENDENT_AMBULATORY_CARE_PROVIDER_SITE_OTHER): Payer: Managed Care, Other (non HMO)

## 2015-06-28 VITALS — BP 99/64 | HR 62 | Resp 16

## 2015-06-28 DIAGNOSIS — M779 Enthesopathy, unspecified: Secondary | ICD-10-CM | POA: Diagnosis not present

## 2015-06-28 DIAGNOSIS — R928 Other abnormal and inconclusive findings on diagnostic imaging of breast: Secondary | ICD-10-CM

## 2015-06-28 DIAGNOSIS — M79671 Pain in right foot: Secondary | ICD-10-CM

## 2015-06-28 MED ORDER — TRIAMCINOLONE ACETONIDE 10 MG/ML IJ SUSP
10.0000 mg | Freq: Once | INTRAMUSCULAR | Status: AC
  Administered 2015-06-28: 10 mg

## 2015-06-28 MED ORDER — DICLOFENAC SODIUM 75 MG PO TBEC: 75.0000 mg | DELAYED_RELEASE_TABLET | Freq: Two times a day (BID) | ORAL | Status: DC

## 2015-06-28 NOTE — Progress Notes (Signed)
   Subjective:    Patient ID: Linda Carey, female    DOB: 1961/04/30, 54 y.o.   MRN: [id number]  HPI Patient presents with foot pain in their right foot. Top of foot near ankle. Past 3 months has gotten worse.  Pt is a Dr. Ralene Cork patient.   Review of Systems  Musculoskeletal: Positive for gait problem.  All other systems reviewed and are negative.      Objective:   Physical Exam        Assessment & Plan:

## 2015-06-30 NOTE — Progress Notes (Signed)
Subjective:     Patient ID: Linda Carey, female   DOB: 06-Aug-1961, 54 y.o.   MRN: [id number]  HPI patient presents with quite a bit of discomfort in the dorsum of the right ankle with inflammation and fluid noted. Patient states that this is been ongoing and has been occurring for a relatively extended period of time but has been recent over the last several months that it has gotten quite a bit worse   Review of Systems  All other systems reviewed and are negative.      Objective:   Physical Exam  Constitutional: She is oriented to person, place, and time.  Cardiovascular: Intact distal pulses.   Musculoskeletal: Normal range of motion.  Neurological: She is alert and oriented to person, place, and time.  Skin: Skin is warm.  Nursing note and vitals reviewed.      Assessment:     Tendinitis of the right ankle dorsal with inflammation and fluid buildup noted    Plan:     H&P and x-rays reviewed of the foot and ankle. Today I injected the dorsal sheath 3 mg Kenalog 5 mg Xylocaine and instructed on immobilization and dispensed air fracture walker to allow the area to rest. Instructed on aggressive physical therapy and reappoint to recheck

## 2015-07-02 ENCOUNTER — Ambulatory Visit
Admission: RE | Admit: 2015-07-02 | Discharge: 2015-07-02 | Disposition: A | Discharge status: Home / Self Care | Payer: Managed Care, Other (non HMO) | Source: Ambulatory Visit | Attending: Physician Assistant | Admitting: Physician Assistant

## 2015-07-02 ENCOUNTER — Other Ambulatory Visit: Payer: Self-pay | Admitting: Physician Assistant

## 2015-07-02 DIAGNOSIS — R928 Other abnormal and inconclusive findings on diagnostic imaging of breast: Secondary | ICD-10-CM

## 2015-07-03 ENCOUNTER — Other Ambulatory Visit: Payer: Managed Care, Other (non HMO)

## 2015-07-08 ENCOUNTER — Other Ambulatory Visit: Payer: Managed Care, Other (non HMO)

## 2015-08-12 ENCOUNTER — Ambulatory Visit: Payer: Managed Care, Other (non HMO) | Admitting: Podiatry

## 2015-12-02 ENCOUNTER — Ambulatory Visit
Admission: RE | Admit: 2015-12-02 | Discharge: 2015-12-02 | Disposition: A | Discharge status: Home / Self Care | Payer: Managed Care, Other (non HMO) | Source: Ambulatory Visit | Attending: Cardiovascular Disease | Admitting: Cardiovascular Disease

## 2015-12-02 ENCOUNTER — Other Ambulatory Visit: Payer: Self-pay | Admitting: Cardiovascular Disease

## 2015-12-02 DIAGNOSIS — R0602 Shortness of breath: Secondary | ICD-10-CM

## 2015-12-25 ENCOUNTER — Encounter: Payer: Self-pay | Admitting: Internal Medicine

## 2016-02-17 ENCOUNTER — Ambulatory Visit: Payer: Managed Care, Other (non HMO) | Admitting: Internal Medicine

## 2017-11-06 IMAGING — CR DG CHEST 2V
2 series · 2 of 2 positions shown · non-contrast
Comparison: PA and lateral chest 10/27/2012 and 02/06/2012.

CLINICAL DATA: Shortness of breath for 1 month with decreased
energy level. Initial encounter.

EXAM:
CHEST  2 VIEW

[w chest pa]
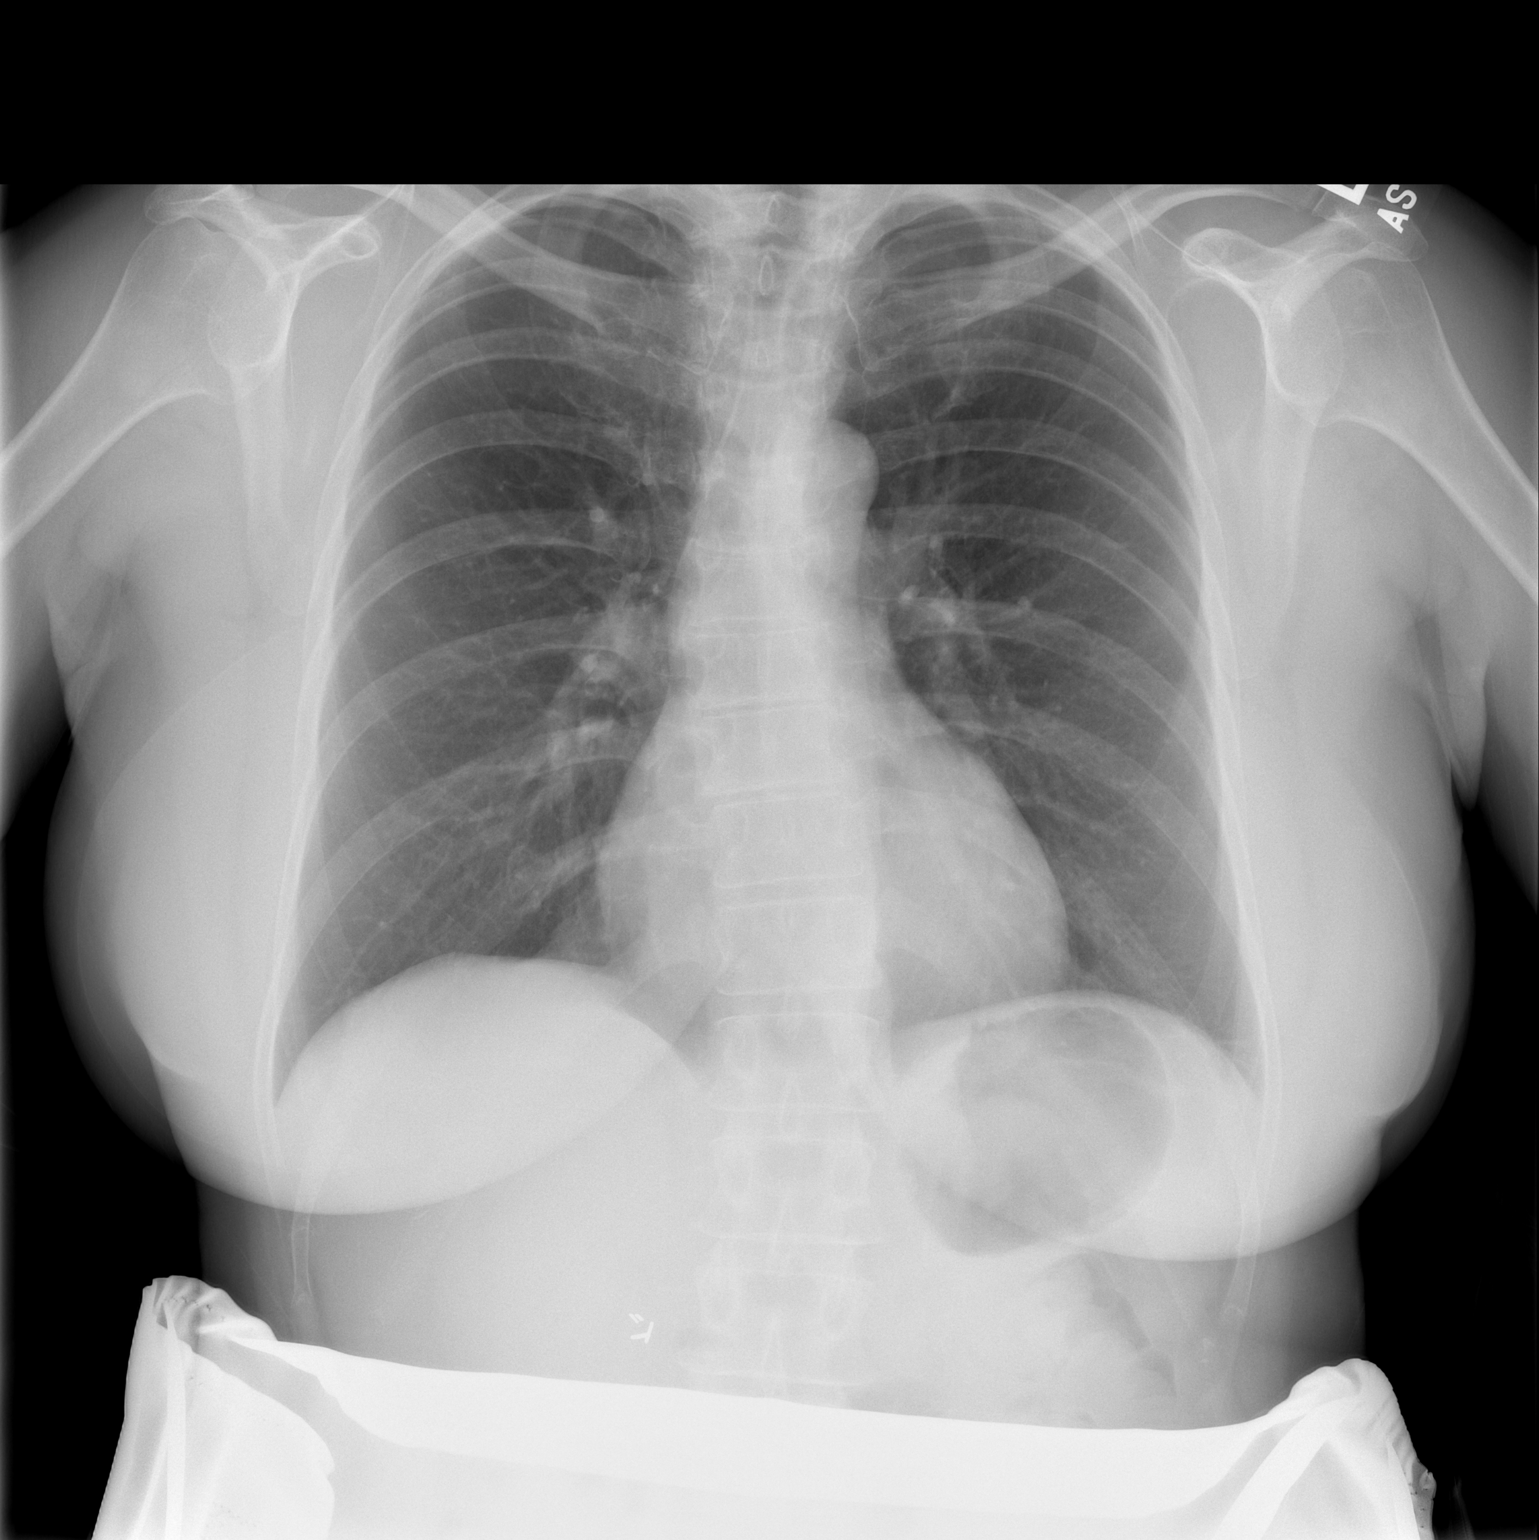

[w chest lat]
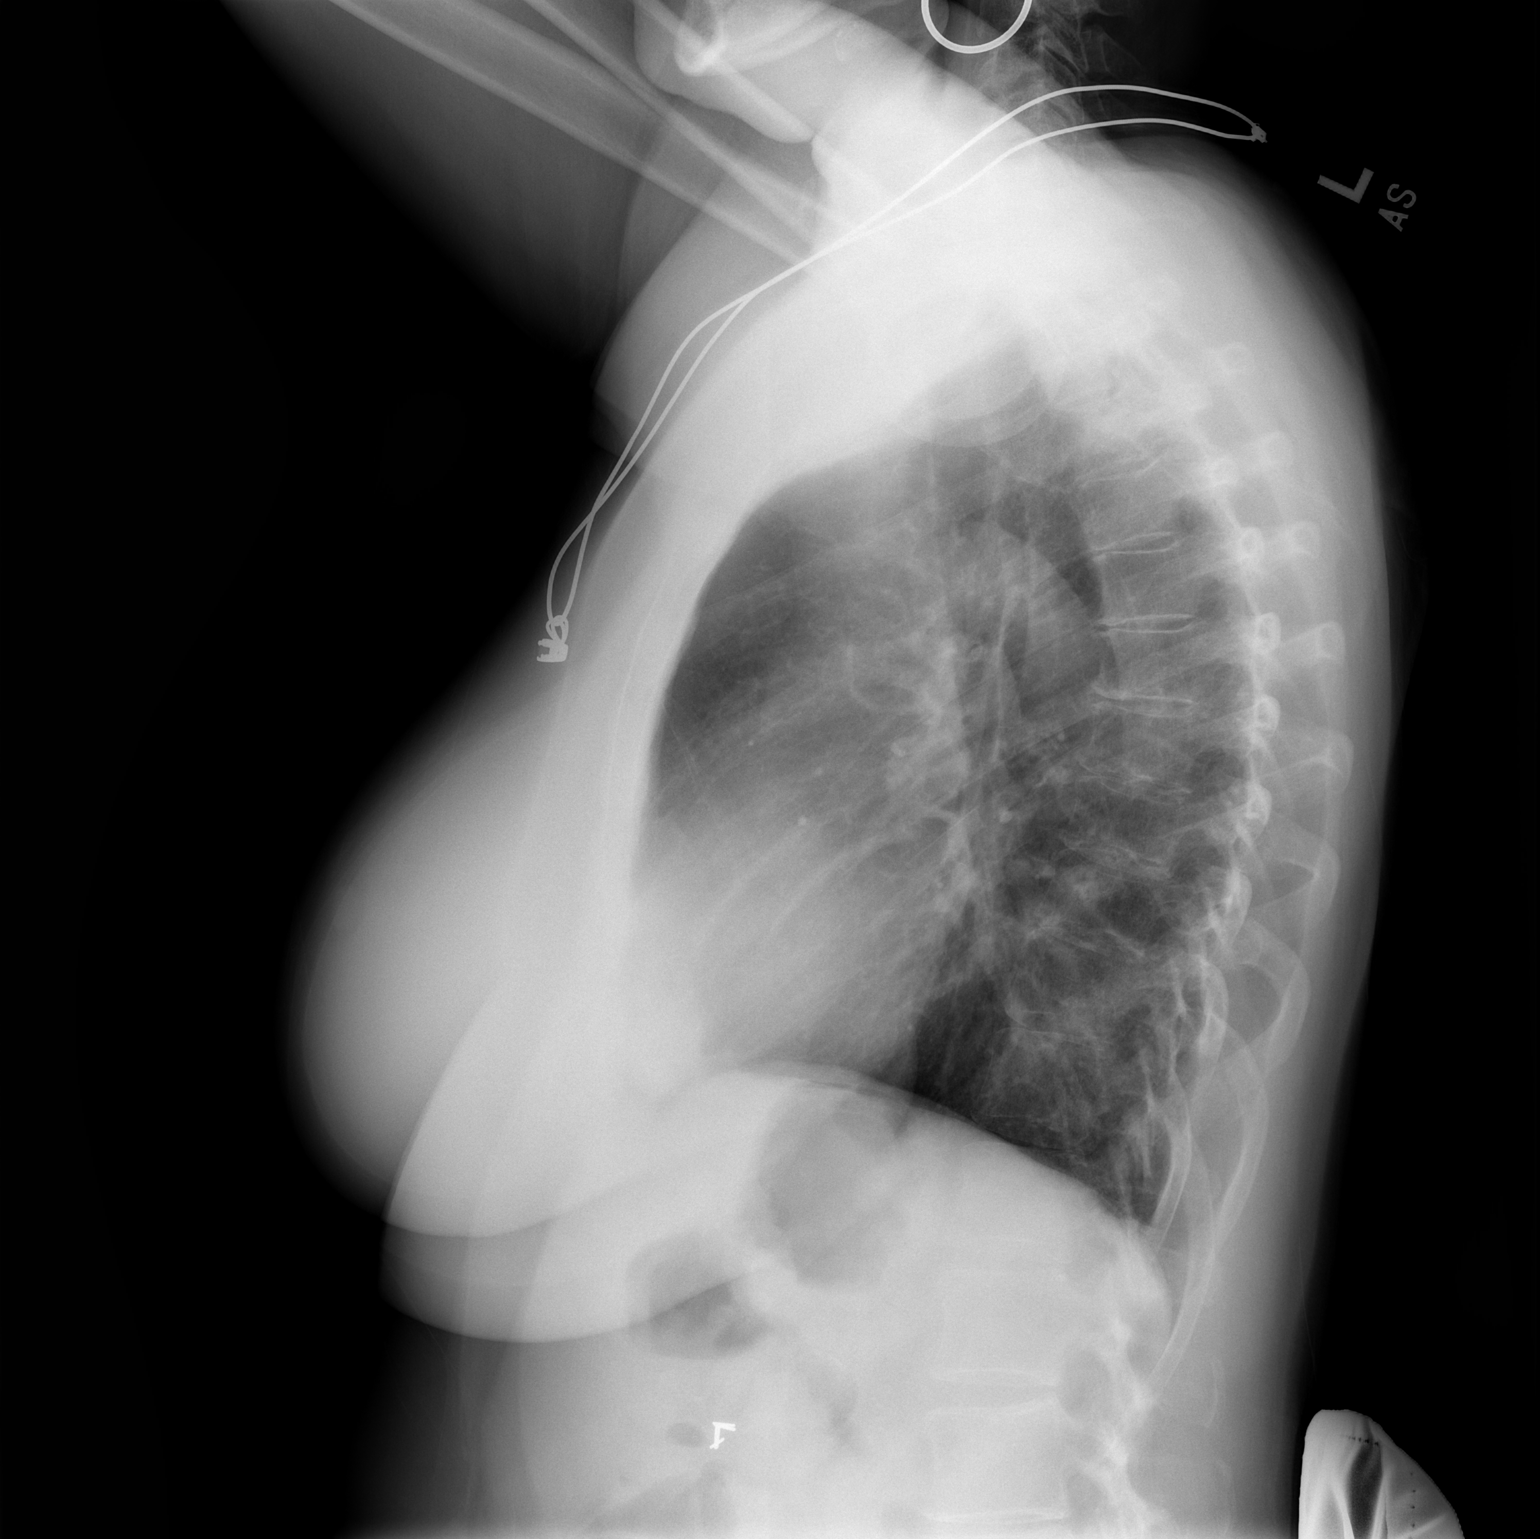

[2 of 2 positions shown; findings below may reference images not displayed]

FINDINGS: The lungs are clear. Heart size is normal. There is no pneumothorax
or pleural effusion. No focal bony abnormality. Mild scoliosis is
noted. The patient is status post cholecystectomy.
IMPRESSION: No acute disease.

## 2018-01-07 ENCOUNTER — Telehealth: Payer: Self-pay

## 2018-01-07 ENCOUNTER — Telehealth: Payer: Self-pay | Admitting: Podiatry

## 2018-01-07 NOTE — Telephone Encounter (Signed)
Spoke with patient, she states that her foot is still hurting, her last office visit was 06/2015. She wanted to know if there was any solution to her pain. I advised her to start wearing her boot, ice and take NSAIDs if tolerated. Transferred her to the schedulers for an appt with [facility]

## 2018-01-07 NOTE — Telephone Encounter (Signed)
I need to get the diagnosis of my last visit with you guys. My phone number is [phone]. Thank you.

## 2018-01-14 ENCOUNTER — Ambulatory Visit (INDEPENDENT_AMBULATORY_CARE_PROVIDER_SITE_OTHER): Payer: BLUE CROSS/BLUE SHIELD

## 2018-01-14 ENCOUNTER — Telehealth: Payer: Self-pay | Admitting: *Deleted

## 2018-01-14 ENCOUNTER — Ambulatory Visit: Payer: BLUE CROSS/BLUE SHIELD | Admitting: Podiatry

## 2018-01-14 ENCOUNTER — Encounter: Payer: Self-pay | Admitting: Podiatry

## 2018-01-14 DIAGNOSIS — M779 Enthesopathy, unspecified: Secondary | ICD-10-CM

## 2018-01-14 NOTE — Telephone Encounter (Signed)
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

## 2018-01-14 NOTE — Telephone Encounter (Signed)
Orders to J. Quintana, RN for pre-cert, faxed to [facility]. 

## 2018-01-16 NOTE — Progress Notes (Signed)
Subjective:   Patient ID: Linda Carey, female   DOB: 57 y.o.   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  At this point I have recommended MRI to try to ascertain as to the degree of problems with the talonavicular inflammatory condition and the possibility for tendon injury.  Patient will have this done and we will review and may end up requiring some form of fusion procedure  Multiple view foot and ankle x-rays right indicate what appears to be severe narrowing of the talonavicular joint right and possibility for ankle inflammation

## 2018-02-01 ENCOUNTER — Other Ambulatory Visit: Payer: Managed Care, Other (non HMO)

## 2019-01-27 ENCOUNTER — Other Ambulatory Visit: Payer: Self-pay | Admitting: Cardiovascular Disease

## 2019-01-27 ENCOUNTER — Ambulatory Visit
Admission: RE | Admit: 2019-01-27 | Discharge: 2019-01-27 | Disposition: A | Discharge status: Home / Self Care | Payer: BLUE CROSS/BLUE SHIELD | Source: Ambulatory Visit | Attending: Cardiovascular Disease | Admitting: Cardiovascular Disease

## 2019-01-27 DIAGNOSIS — R059 Cough, unspecified: Secondary | ICD-10-CM

## 2019-01-27 DIAGNOSIS — R05 Cough: Secondary | ICD-10-CM

## 2021-01-01 IMAGING — CR CHEST - 2 VIEW
2 series · 2 of 2 positions shown · non-contrast
Comparison: December 02, 2015.

CLINICAL DATA: Cough and chest tightness

EXAM:
CHEST - 2 VIEW

[w chest pa]
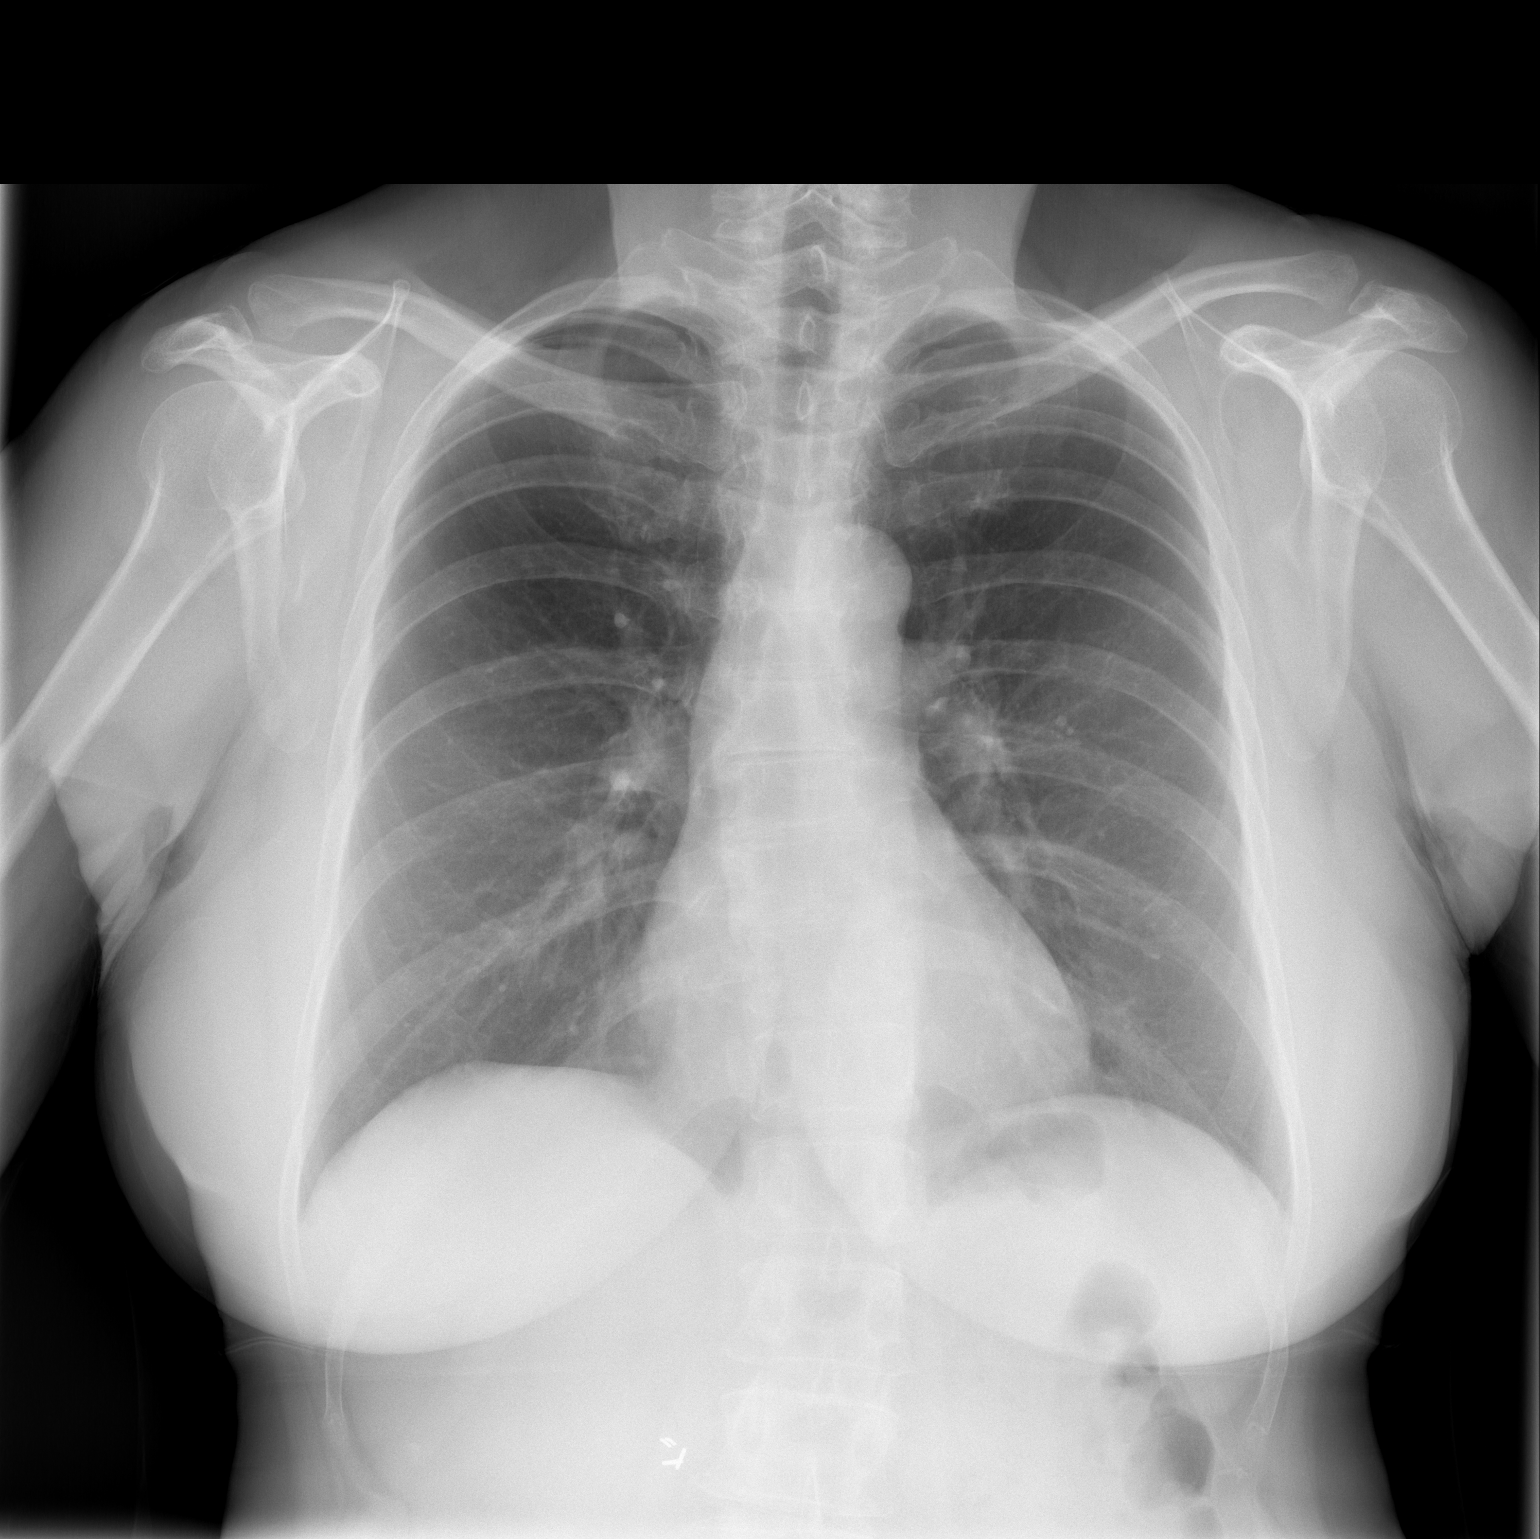

[w chest lat]
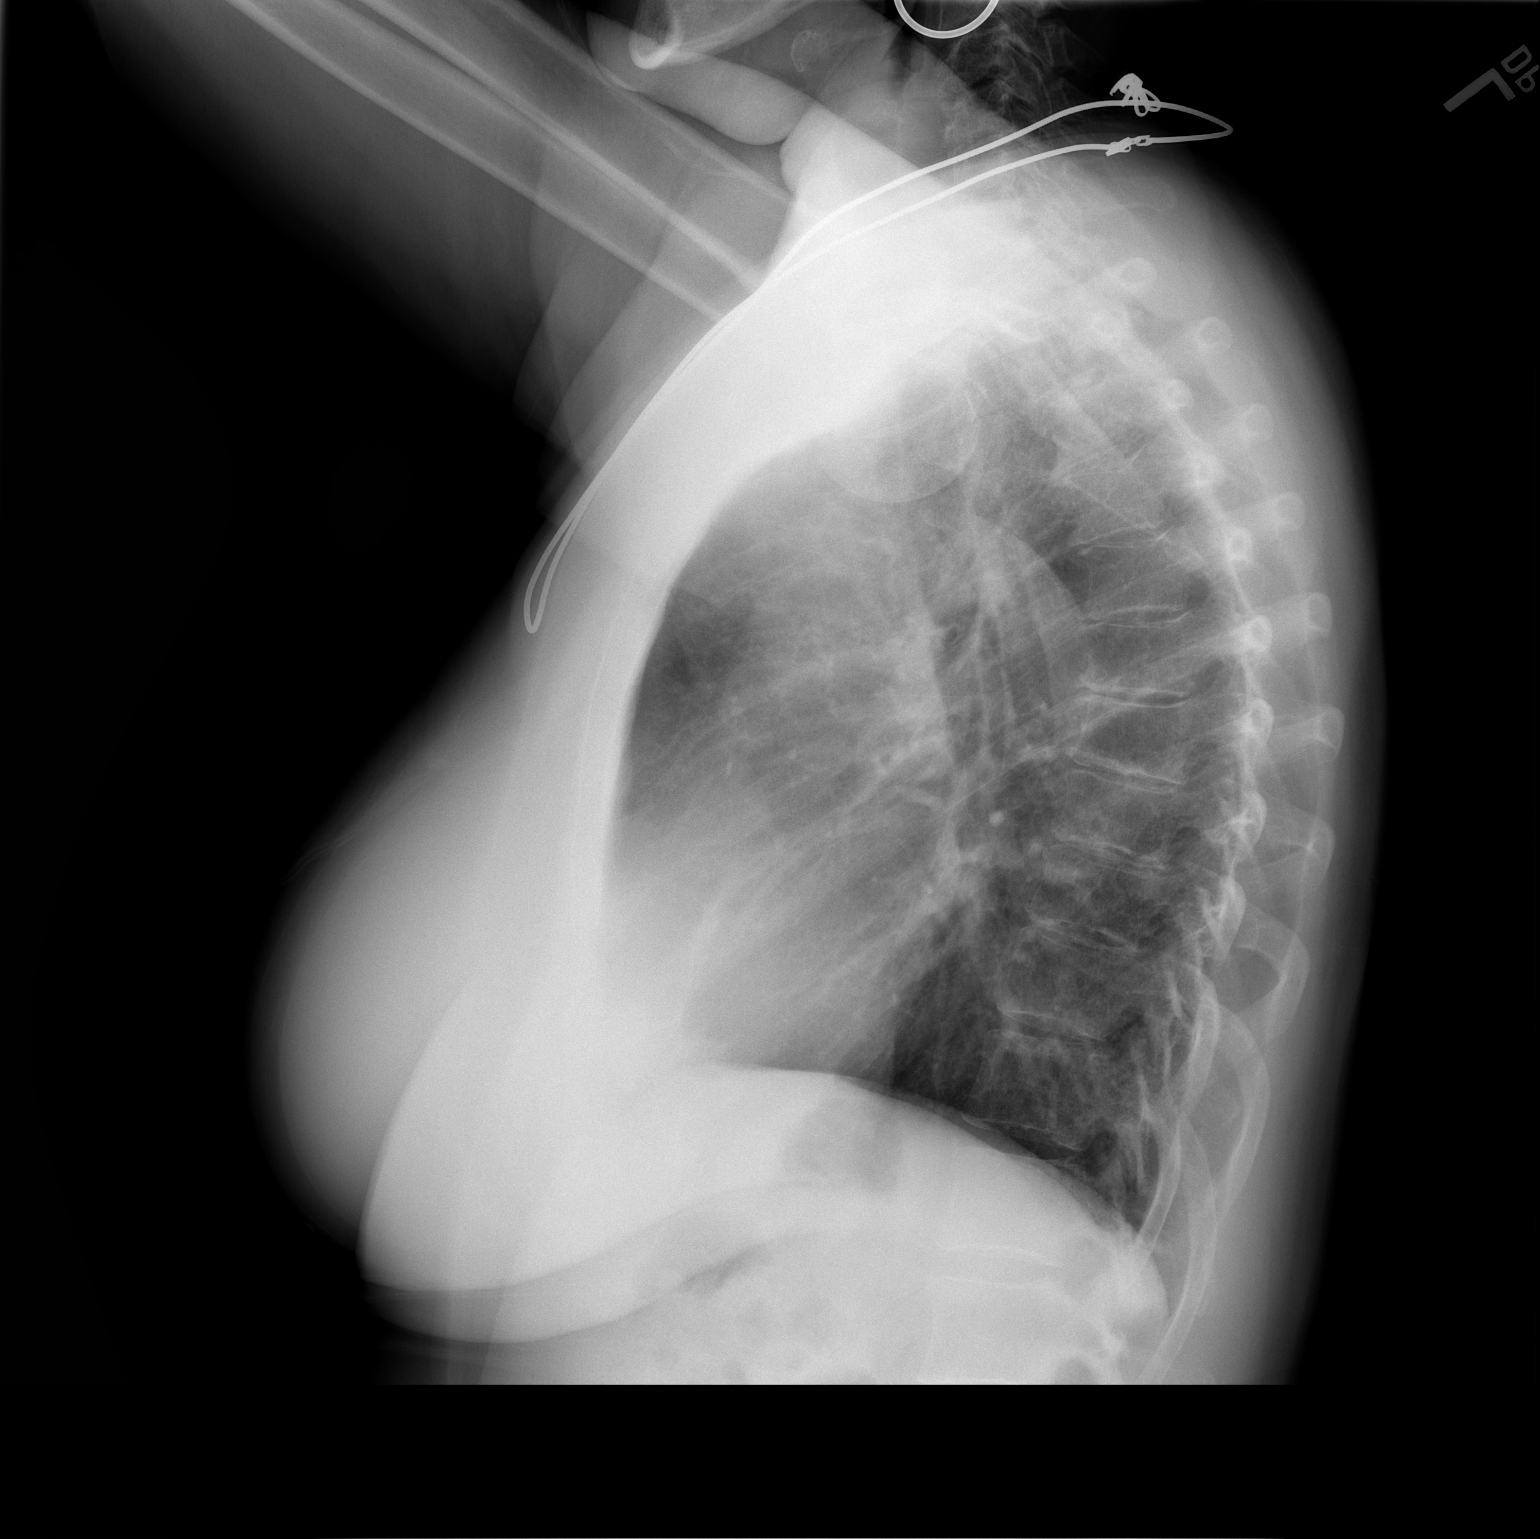

[2 of 2 positions shown; findings below may reference images not displayed]

FINDINGS: Lungs are clear. Heart size and pulmonary vascularity are normal. No
adenopathy. No pneumothorax. No bone lesions. There are surgical
clips in the gallbladder fossa region.
IMPRESSION: No edema or consolidation.

## 2021-11-21 ENCOUNTER — Ambulatory Visit: Payer: BC Managed Care – PPO | Admitting: Podiatry

## 2021-11-27 ENCOUNTER — Ambulatory Visit: Payer: BC Managed Care – PPO | Admitting: Podiatry

## 2024-01-04 ENCOUNTER — Ambulatory Visit: Admitting: Podiatry

## 2024-03-22 ENCOUNTER — Other Ambulatory Visit (HOSPITAL_BASED_OUTPATIENT_CLINIC_OR_DEPARTMENT_OTHER): Payer: Self-pay | Admitting: Nurse Practitioner

## 2024-03-22 DIAGNOSIS — Z1231 Encounter for screening mammogram for malignant neoplasm of breast: Secondary | ICD-10-CM

## 2024-03-22 DIAGNOSIS — Z780 Asymptomatic menopausal state: Secondary | ICD-10-CM

## 2024-04-05 ENCOUNTER — Encounter (HOSPITAL_BASED_OUTPATIENT_CLINIC_OR_DEPARTMENT_OTHER): Payer: Self-pay

## 2024-04-12 ENCOUNTER — Encounter (HOSPITAL_BASED_OUTPATIENT_CLINIC_OR_DEPARTMENT_OTHER): Payer: Self-pay

## 2024-05-09 ENCOUNTER — Ambulatory Visit: Payer: Self-pay
